# Patient Record
Sex: Male | Born: 1955 | Race: White | Hispanic: No | Marital: Married | Smoking: Former smoker
Health system: Southern US, Community
[De-identification: ages and names within clinical notes are randomized; demographics above are authoritative.]

## PROBLEM LIST (undated history)

## (undated) DIAGNOSIS — K649 Unspecified hemorrhoids: Secondary | ICD-10-CM

## (undated) DIAGNOSIS — R0602 Shortness of breath: Secondary | ICD-10-CM

## (undated) DIAGNOSIS — I313 Pericardial effusion (noninflammatory): Secondary | ICD-10-CM

## (undated) DIAGNOSIS — I3139 Other pericardial effusion (noninflammatory): Secondary | ICD-10-CM

## (undated) HISTORY — DX: Pericardial effusion (noninflammatory): I31.3

## (undated) HISTORY — PX: GANGLION CYST EXCISION: SHX1691

## (undated) HISTORY — DX: Other pericardial effusion (noninflammatory): I31.39

## (undated) HISTORY — PX: COLONOSCOPY: SHX174

## (undated) HISTORY — PX: POLYPECTOMY: SHX149

## (undated) HISTORY — DX: Shortness of breath: R06.02

## (undated) HISTORY — DX: Unspecified hemorrhoids: K64.9

---

## 2015-06-24 DIAGNOSIS — K080 Exfoliation of teeth due to systemic causes: Secondary | ICD-10-CM | POA: Diagnosis not present

## 2015-09-07 DIAGNOSIS — Z13220 Encounter for screening for lipoid disorders: Secondary | ICD-10-CM | POA: Diagnosis not present

## 2015-09-07 DIAGNOSIS — Z125 Encounter for screening for malignant neoplasm of prostate: Secondary | ICD-10-CM | POA: Diagnosis not present

## 2015-09-07 DIAGNOSIS — Z0000 Encounter for general adult medical examination without abnormal findings: Secondary | ICD-10-CM | POA: Diagnosis not present

## 2015-09-14 DIAGNOSIS — Z0000 Encounter for general adult medical examination without abnormal findings: Secondary | ICD-10-CM | POA: Diagnosis not present

## 2015-12-29 DIAGNOSIS — Z23 Encounter for immunization: Secondary | ICD-10-CM | POA: Diagnosis not present

## 2016-01-31 DIAGNOSIS — K080 Exfoliation of teeth due to systemic causes: Secondary | ICD-10-CM | POA: Diagnosis not present

## 2016-09-10 DIAGNOSIS — Z125 Encounter for screening for malignant neoplasm of prostate: Secondary | ICD-10-CM | POA: Diagnosis not present

## 2016-09-10 DIAGNOSIS — K080 Exfoliation of teeth due to systemic causes: Secondary | ICD-10-CM | POA: Diagnosis not present

## 2016-09-10 DIAGNOSIS — B029 Zoster without complications: Secondary | ICD-10-CM | POA: Diagnosis not present

## 2016-09-10 DIAGNOSIS — Z0000 Encounter for general adult medical examination without abnormal findings: Secondary | ICD-10-CM | POA: Diagnosis not present

## 2016-09-17 DIAGNOSIS — K080 Exfoliation of teeth due to systemic causes: Secondary | ICD-10-CM | POA: Diagnosis not present

## 2016-09-26 DIAGNOSIS — Z0000 Encounter for general adult medical examination without abnormal findings: Secondary | ICD-10-CM | POA: Diagnosis not present

## 2016-09-26 DIAGNOSIS — R7301 Impaired fasting glucose: Secondary | ICD-10-CM | POA: Diagnosis not present

## 2016-09-26 DIAGNOSIS — L821 Other seborrheic keratosis: Secondary | ICD-10-CM | POA: Diagnosis not present

## 2016-11-09 DIAGNOSIS — H9313 Tinnitus, bilateral: Secondary | ICD-10-CM | POA: Diagnosis not present

## 2016-11-09 DIAGNOSIS — H903 Sensorineural hearing loss, bilateral: Secondary | ICD-10-CM | POA: Diagnosis not present

## 2016-12-05 DIAGNOSIS — H903 Sensorineural hearing loss, bilateral: Secondary | ICD-10-CM | POA: Diagnosis not present

## 2016-12-05 DIAGNOSIS — Z461 Encounter for fitting and adjustment of hearing aid: Secondary | ICD-10-CM | POA: Diagnosis not present

## 2017-01-02 DIAGNOSIS — B0089 Other herpesviral infection: Secondary | ICD-10-CM | POA: Diagnosis not present

## 2017-02-12 DIAGNOSIS — H524 Presbyopia: Secondary | ICD-10-CM | POA: Diagnosis not present

## 2017-02-12 DIAGNOSIS — Z87891 Personal history of nicotine dependence: Secondary | ICD-10-CM | POA: Diagnosis not present

## 2017-02-12 DIAGNOSIS — H47091 Other disorders of optic nerve, not elsewhere classified, right eye: Secondary | ICD-10-CM | POA: Diagnosis not present

## 2017-02-12 DIAGNOSIS — H25093 Other age-related incipient cataract, bilateral: Secondary | ICD-10-CM | POA: Diagnosis not present

## 2017-03-06 DIAGNOSIS — H524 Presbyopia: Secondary | ICD-10-CM | POA: Diagnosis not present

## 2017-05-09 DIAGNOSIS — K080 Exfoliation of teeth due to systemic causes: Secondary | ICD-10-CM | POA: Diagnosis not present

## 2017-09-24 DIAGNOSIS — Z0000 Encounter for general adult medical examination without abnormal findings: Secondary | ICD-10-CM | POA: Diagnosis not present

## 2017-09-24 DIAGNOSIS — Z125 Encounter for screening for malignant neoplasm of prostate: Secondary | ICD-10-CM | POA: Diagnosis not present

## 2017-09-24 DIAGNOSIS — R7301 Impaired fasting glucose: Secondary | ICD-10-CM | POA: Diagnosis not present

## 2017-09-24 DIAGNOSIS — Z13220 Encounter for screening for lipoid disorders: Secondary | ICD-10-CM | POA: Diagnosis not present

## 2017-10-01 DIAGNOSIS — Z0000 Encounter for general adult medical examination without abnormal findings: Secondary | ICD-10-CM | POA: Diagnosis not present

## 2017-10-15 ENCOUNTER — Encounter: Payer: Self-pay | Admitting: Gastroenterology

## 2017-11-22 ENCOUNTER — Ambulatory Visit (AMBULATORY_SURGERY_CENTER): Payer: Self-pay | Admitting: *Deleted

## 2017-11-22 ENCOUNTER — Other Ambulatory Visit: Payer: Self-pay

## 2017-11-22 VITALS — Ht 67.0 in | Wt 184.0 lb

## 2017-11-22 DIAGNOSIS — Z1211 Encounter for screening for malignant neoplasm of colon: Secondary | ICD-10-CM

## 2017-11-22 MED ORDER — SUPREP BOWEL PREP KIT 17.5-3.13-1.6 GM/177ML PO SOLN: 1.0000 | Freq: Once | ORAL | 0 refills | Status: AC

## 2017-11-22 NOTE — Progress Notes (Signed)
No egg or soy allergy known to patient  No issues with past sedation with any surgeries  or procedures, no intubation problems  No diet pills per patient No home 02 use per patient  No blood thinners per patient  Pt denies issues with constipation  No A fib or A flutter  EMMI video sent to pt's e mail  

## 2017-11-25 ENCOUNTER — Encounter: Payer: Self-pay | Admitting: Gastroenterology

## 2017-11-27 ENCOUNTER — Telehealth: Payer: Self-pay | Admitting: Gastroenterology

## 2017-11-27 NOTE — Telephone Encounter (Signed)
Pt has a colon 10-11 and his work is giving flu shots 10-8- pt asked if this was ok for him to get the flu shot 10-8- instructed is okay. No issues  For colon 10-11  Marijean Niemann

## 2017-12-06 ENCOUNTER — Encounter: Payer: Self-pay | Admitting: Gastroenterology

## 2017-12-06 ENCOUNTER — Ambulatory Visit (AMBULATORY_SURGERY_CENTER): Payer: Federal, State, Local not specified - PPO | Admitting: Gastroenterology

## 2017-12-06 VITALS — BP 108/70 | HR 64 | Temp 98.2°F | Resp 14 | Ht 67.0 in | Wt 184.0 lb

## 2017-12-06 DIAGNOSIS — Z1211 Encounter for screening for malignant neoplasm of colon: Secondary | ICD-10-CM | POA: Diagnosis not present

## 2017-12-06 DIAGNOSIS — D122 Benign neoplasm of ascending colon: Secondary | ICD-10-CM | POA: Diagnosis not present

## 2017-12-06 DIAGNOSIS — D125 Benign neoplasm of sigmoid colon: Secondary | ICD-10-CM | POA: Diagnosis not present

## 2017-12-06 DIAGNOSIS — K640 First degree hemorrhoids: Secondary | ICD-10-CM

## 2017-12-06 MED ORDER — SODIUM CHLORIDE 0.9 % IV SOLN: 500.0000 mL | Freq: Once | INTRAVENOUS | Status: DC

## 2017-12-06 NOTE — Patient Instructions (Signed)
Impression/Recommendations:  Polyp handout given to patient. Hemorrhoid handout given to patient. Resume previous diet. Resume Aspirin at prior dose in 5 days.  Repeat colonoscopy in 3-5 years for surveillance based on pathology results.  YOU HAD AN ENDOSCOPIC PROCEDURE TODAY AT [facility]:   Refer to the procedure report that was given to you for any specific questions about what was found during the examination.  If the procedure report does not answer your questions, please call your gastroenterologist to clarify.  If you requested that your care partner not be given the details of your procedure findings, then the procedure report has been included in a sealed envelope for you to review at your convenience later.  YOU SHOULD EXPECT: Some feelings of bloating in the abdomen. Passage of more gas than usual.  Walking can help get rid of the air that was put into your GI tract during the procedure and reduce the bloating. If you had a lower endoscopy (such as a colonoscopy or flexible sigmoidoscopy) you may notice spotting of blood in your stool or on the toilet paper. If you underwent a bowel prep for your procedure, you may not have a normal bowel movement for a few days.  Please Note:  You might notice some irritation and congestion in your nose or some drainage.  This is from the oxygen used during your procedure.  There is no need for concern and it should clear up in a day or so.  SYMPTOMS TO REPORT IMMEDIATELY:   Following lower endoscopy (colonoscopy or flexible sigmoidoscopy):  Excessive amounts of blood in the stool  Significant tenderness or worsening of abdominal pains  Swelling of the abdomen that is new, acute  Fever of 100F or higher  For urgent or emergent issues, a gastroenterologist can be reached at any hour by calling [phone].   DIET:  We do recommend a small meal at first, but then you may proceed to your regular diet.  Drink plenty of fluids  but you should avoid alcoholic beverages for 24 hours.  ACTIVITY:  You should plan to take it easy for the rest of today and you should NOT DRIVE or use heavy machinery until tomorrow (because of the sedation medicines used during the test).    FOLLOW UP: Our staff will call the number listed on your records the next business day following your procedure to check on you and address any questions or concerns that you may have regarding the information given to you following your procedure. If we do not reach you, we will leave a message.  However, if you are feeling well and you are not experiencing any problems, there is no need to return our call.  We will assume that you have returned to your regular daily activities without incident.  If any biopsies were taken you will be contacted by phone or by letter within the next 1-3 weeks.  Please call us at [phone] if you have not heard about the biopsies in 3 weeks.    SIGNATURES/CONFIDENTIALITY: You and/or your care partner have signed paperwork which will be entered into your electronic medical record.  These signatures attest to the fact that that the information above on your After Visit Summary has been reviewed and is understood.  Full responsibility of the confidentiality of this discharge information lies with you and/or your care-partner.

## 2017-12-06 NOTE — Op Note (Signed)
[place] Patient Name: Levi Knight Procedure Date: 12/06/2017 8:57 AM MRN: [id number] Endoscopist: Gerrit Heck , MD Age: 62 Referring MD:  Date of Birth: 05/12/1955 Gender: Male Account #: [id number] Procedure:                Colonoscopy Indications:              Screening for colorectal malignant neoplasm, This                            is the patient's first colonoscopy Medicines:                Monitored Anesthesia Care Procedure:                Pre-Anesthesia Assessment:                           - Prior to the procedure, a History and Physical                            was performed, and patient medications and                            allergies were reviewed. The patient's tolerance of                            previous anesthesia was also reviewed. The risks                            and benefits of the procedure and the sedation                            options and risks were discussed with the patient.                            All questions were answered, and informed consent                            was obtained. Prior Anticoagulants: The patient has                            taken aspirin, last dose was 5 days prior to                            procedure. ASA Grade Assessment: II - A patient                            with mild systemic disease. After reviewing the                            risks and benefits, the patient was deemed in                            satisfactory condition to undergo the procedure.  After obtaining informed consent, the colonoscope                            was passed under direct vision. Throughout the                            procedure, the patient's blood pressure, pulse, and                            oxygen saturations were monitored continuously. The                            Colonoscope was introduced through the anus and                            advanced to the the  cecum, identified by                            appendiceal orifice and ileocecal valve. The                            colonoscopy was performed without difficulty. The                            patient tolerated the procedure well. The quality                            of the bowel preparation was adequate. Scope In: 9:13:40 AM Scope Out: 9:33:11 AM Scope Withdrawal Time: 0 hours 15 minutes 40 seconds  Total Procedure Duration: 0 hours 19 minutes 31 seconds  Findings:                 The perianal and digital rectal examinations were                            normal.                           A 3 mm polyp was found in the ascending colon. The                            polyp was sessile. The polyp was removed with a                            cold snare. Resection and retrieval were complete.                            Estimated blood loss was minimal.                           A 3 mm polyp was found in the sigmoid colon. The                            polyp was sessile. The polyp was removed with  a                            cold snare. Resection and retrieval were complete.                            Estimated blood loss was minimal.                           Two sessile polyps were found in the sigmoid colon.                            The polyps were 3 to 5 mm in size. These polyps                            were removed with a cold snare. Resection and                            retrieval were complete. Estimated blood loss was                            minimal.                           Non-bleeding internal hemorrhoids were found during                            retroflexion. The hemorrhoids were small. Complications:            No immediate complications. Estimated Blood Loss:     Estimated blood loss was minimal. Impression:               - One 3 mm polyp in the ascending colon, removed                            with a cold snare. Resected and retrieved.                            - One 3 mm polyp in the sigmoid colon, removed with                            a cold snare. Resected and retrieved.                           - Two 3 to 5 mm polyps in the sigmoid colon,                            removed with a cold snare. Resected and retrieved.                           - Non-bleeding internal hemorrhoids. Recommendation:           - Patient has a contact number available for  emergencies. The signs and symptoms of potential                            delayed complications were discussed with the                            patient. Return to normal activities tomorrow.                            Written discharge instructions were provided to the                            patient.                           - Resume previous diet today.                           - Resume aspirin at prior dose in 5 days.                           - Await pathology results.                           - Repeat colonoscopy in 3 - 5 years for                            surveillance based on pathology results.                           - Return to GI clinic PRN.                           - Internal hemorrhoids were noted on this study and                            may be amenable to hemorrhoid band ligation. If you                            are interested in further treatment of these                            hemorrhoids with band ligation, please contact my                            clinic to set up an appointment for evaluation and                            treatment. Gerrit Heck, MD 12/06/2017 9:37:24 AM

## 2017-12-06 NOTE — Progress Notes (Signed)
To PACU, vss patent aw report to rn 

## 2017-12-06 NOTE — Progress Notes (Signed)
Called to room to assist during endoscopic procedure.  Patient ID and intended procedure confirmed with present staff. Received instructions for my participation in the procedure from the performing physician.  

## 2017-12-09 ENCOUNTER — Telehealth: Payer: Self-pay

## 2017-12-09 NOTE — Telephone Encounter (Signed)
  Follow up Call-  Call Yasser Hepp number 12/06/2017  Post procedure Call Jamone Garrido phone  # [phone]  Permission to leave phone message No  Some recent data might be hidden     Patient questions:  Do you have a fever, pain , or abdominal swelling? No. Pain Score  0 *  Have you tolerated food without any problems? Yes.    Have you been able to return to your normal activities? Yes.    Do you have any questions about your discharge instructions: Diet   No. Medications  No. Follow up visit  No.  Do you have questions or concerns about your Care? No.  Actions: * If pain score is 4 or above: No action needed, pain <4.

## 2017-12-12 ENCOUNTER — Encounter: Payer: Self-pay | Admitting: Gastroenterology

## 2018-01-17 DIAGNOSIS — K080 Exfoliation of teeth due to systemic causes: Secondary | ICD-10-CM | POA: Diagnosis not present

## 2018-03-25 DIAGNOSIS — R062 Wheezing: Secondary | ICD-10-CM | POA: Diagnosis not present

## 2018-03-25 DIAGNOSIS — J9811 Atelectasis: Secondary | ICD-10-CM | POA: Diagnosis not present

## 2018-03-25 DIAGNOSIS — J069 Acute upper respiratory infection, unspecified: Secondary | ICD-10-CM | POA: Diagnosis not present

## 2018-04-08 DIAGNOSIS — J0100 Acute maxillary sinusitis, unspecified: Secondary | ICD-10-CM | POA: Diagnosis not present

## 2018-04-08 DIAGNOSIS — R05 Cough: Secondary | ICD-10-CM | POA: Diagnosis not present

## 2018-04-08 DIAGNOSIS — R0789 Other chest pain: Secondary | ICD-10-CM | POA: Diagnosis not present

## 2018-05-06 DIAGNOSIS — M79671 Pain in right foot: Secondary | ICD-10-CM | POA: Diagnosis not present

## 2018-05-12 ENCOUNTER — Other Ambulatory Visit: Payer: Self-pay | Admitting: Family Medicine

## 2018-05-12 ENCOUNTER — Other Ambulatory Visit: Payer: Self-pay

## 2018-05-12 ENCOUNTER — Ambulatory Visit (HOSPITAL_COMMUNITY)
Admission: RE | Admit: 2018-05-12 | Discharge: 2018-05-12 | Disposition: A | Discharge status: Home / Self Care | Payer: Federal, State, Local not specified - PPO | Source: Ambulatory Visit | Attending: Family Medicine | Admitting: Family Medicine

## 2018-05-12 ENCOUNTER — Other Ambulatory Visit (HOSPITAL_COMMUNITY): Payer: Self-pay | Admitting: Family Medicine

## 2018-05-12 DIAGNOSIS — R0602 Shortness of breath: Secondary | ICD-10-CM | POA: Diagnosis not present

## 2018-05-12 DIAGNOSIS — R079 Chest pain, unspecified: Secondary | ICD-10-CM

## 2018-05-12 DIAGNOSIS — R000 Tachycardia, unspecified: Secondary | ICD-10-CM

## 2018-05-12 DIAGNOSIS — R6889 Other general symptoms and signs: Secondary | ICD-10-CM | POA: Diagnosis not present

## 2018-05-12 MED ORDER — IOHEXOL 350 MG/ML SOLN
75.0000 mL | Freq: Once | INTRAVENOUS | Status: AC | PRN
  Administered 2018-05-12: 75 mL via INTRAVENOUS

## 2018-05-14 NOTE — Progress Notes (Signed)
Patient referred by Janie Morning for Pericardial effusion  Subjective:   @Patient  ID: Levi Knight, male    DOB: 03-Nov-1955, 63 y.o.   MRN: [id number]  Chief Complaint  Patient presents with  . New Patient (Initial Visit)  . Results    abn CT  . Shortness of Breath  . Pericardial Effusion    HPI Mr. Levi Knight is a 63 year old Caucasian male with no significant prior cardiovascular history, there is no history of hypertension, diabetes, hyperlipidemia, tobacco use, referred to me for evaluation of chest pain that started after he had a bout of flu in end of January 2020, since then has had shortness of breath and was treated again for pneumonia then developed persistent chest tightness and also shortness of breath.  Chest pain described as sharp pain to pressure-like sensation when she takes a deep breath, unable to take deep breath due to chest pain.  No hemoptysis, no leg edema.  Symptoms have been persistent since last 6 weeks.  Also states that he is unable to lay down flat and feels better when he sits up due to chest pain.  Accompanied by his wife.  Past Medical History:  Diagnosis Date  . Hemorrhoids   . Pericardial effusion   . SOB (shortness of breath)     Past Surgical History:  Procedure Laterality Date  . GANGLION CYST EXCISION      Social History   Socioeconomic History  . Marital status: Married    Spouse name: Not on file  . Number of children: 2  . Years of education: Not on file  . Highest education level: Not on file  Occupational History  . Not on file  Social Needs  . Financial resource strain: Not on file  . Food insecurity:    Worry: Not on file    Inability: Not on file  . Transportation needs:    Medical: Not on file    Non-medical: Not on file  Tobacco Use  . Smoking status: Former Smoker    Packs/day: 1.50    Years: 10.00    Pack years: 15.00    Types: Cigarettes    Last attempt to quit: 05/15/1978    Years since quitting:  40.0  . Smokeless tobacco: Current User    Types: Snuff  Substance and Sexual Activity  . Alcohol use: Yes    Alcohol/week: 3.0 standard drinks    Types: 3 Cans of beer per week    Comment: on weekends  . Drug use: Never  . Sexual activity: Not on file  Lifestyle  . Physical activity:    Days per week: Not on file    Minutes per session: Not on file  . Stress: Not on file  Relationships  . Social connections:    Talks on phone: Not on file    Gets together: Not on file    Attends religious service: Not on file    Active member of club or organization: Not on file    Attends meetings of clubs or organizations: Not on file    Relationship status: Not on file  . Intimate partner violence:    Fear of current or ex partner: Not on file    Emotionally abused: Not on file    Physically abused: Not on file    Forced sexual activity: Not on file  Other Topics Concern  . Not on file  Social History Narrative  . Not on file    Current  Outpatient Medications on File Prior to Visit  Medication Sig Dispense Refill  . aspirin EC 325 MG tablet Take 325 mg by mouth. Twice weekly    . Multiple Vitamin (MULTI-VITAMIN DAILY PO) Take by mouth.    . Multiple Vitamins-Minerals (EMERGEN-C FIVE) PACK Take by mouth daily.    . naproxen sodium (ALEVE) 220 MG tablet Take 220 mg by mouth continuous as needed.     Marland Kitchen OVER THE COUNTER MEDICATION Super B complex po daily    . UNABLE TO FIND Osteo bi-Flex po daily    . UNABLE TO FIND Ester C vit. C 1000 mg po daily    . UNABLE TO FIND Echinacea po daily    . UNABLE TO FIND Urinozinc-Prostate po daily    . valACYclovir (VALTREX) 1000 MG tablet as needed.     No current facility-administered medications on file prior to visit.     Cardiac Studies:  CT Chest Angio 05/12/2018: 1.  No evidence of pulmonary embolism. 2. Small to moderate pericardial effusion. Small bilateral pleural effusions. 3. Several 5-6 mm pulmonary nodules in both lungs.  Non-contrast chest CT at 3-6 months is recommended.  Recent Labs:  05/12/2018: Cr 1.2. H/H 16.5/49. MCV 89.1. WBW 4.7. RBC 5.5. Rest of CBC normal.  Rapid Influenza A&B Negative.   Review of Systems  Constitutional: Negative for unexpected weight change.  HENT: Negative for congestion.   Eyes: Negative for visual disturbance.  Respiratory: Positive for shortness of breath (feels like he is unable to take deep breath due to pain).   Cardiovascular: Positive for chest pain (on taking deep breath).  Gastrointestinal: Negative for abdominal pain, nausea and vomiting.  Endocrine: Negative for cold intolerance.  Genitourinary: Negative for dysuria.  Musculoskeletal: Negative for myalgias.  Skin: Negative for rash.  Allergic/Immunologic: Negative for immunocompromised state.  Neurological: Negative for dizziness.  Hematological: Does not bruise/bleed easily.  Psychiatric/Behavioral: The patient is not nervous/anxious.   All other systems reviewed and are negative.     Objective: Blood pressure 108/80, pulse 92, height 5\' 7"  (1.702 m), weight 184 lb 8 oz (83.7 kg), SpO2 95 %.  Body mass index is 28.9 kg/m.    Physical Exam Vitals signs and nursing note reviewed.  Constitutional:      General: He is not in acute distress.    Appearance: Normal appearance. He is not toxic-appearing.  Eyes:     Pupils: Pupils are equal, round, and reactive to light.  Neck:     Musculoskeletal: Neck supple. No muscular tenderness.  Cardiovascular:     Rate and Rhythm: Normal rate.     Pulses: Normal pulses.     Heart sounds: Normal heart sounds. No friction rub.  Abdominal:     General: Abdomen is flat. Bowel sounds are normal. There is no distension.     Palpations: Abdomen is soft.  Musculoskeletal:        General: No tenderness.  Lymphadenopathy:     Cervical: No cervical adenopathy.  Skin:    General: Skin is warm.  Neurological:     General: No focal deficit present.     Mental Status: He  is alert and oriented to person, place, and time.  Psychiatric:        Mood and Affect: Mood normal.       Assessment & Recommendations:   Acute idiopathic pericarditis - Plan: colchicine 0.6 MG tablet, indomethacin (INDOCIN) 25 MG capsule, PCV ECHOCARDIOGRAM COMPLETE  Pericardial effusion - Plan: EKG 12-Lead, PCV ECHOCARDIOGRAM  COMPLETE  SOB (shortness of breath)  Gastroesophageal reflux disease without esophagitis   EKG 05/15/2018: Normal sinus rhythm at rate of 92 bpm, normal axis.  No evidence of ischemia, normal EKG.  Borderline low voltage complexes. Thank you for referring the patient to [place]. Please feel free to contact with any questions.  Recommendation: Patient referred to me for evaluation of shortness of breath and chest pain, abnormal CT scan revealing moderate size pericardial effusion.  His clinical symptomatology and presentation is very consistent with probably idiopathic pericarditis although he had flu month ago.  There is no clinical evidence of heart failure or  Tamponade.  I'll start the patient on colchicine 0.6 minute grams p.o. b.i.d. for a total of 3 months as it is the initial episode of peritonitis.  I'll also start him on indomethacin 25 g p.o. b.i.d. for 3-4 weeks.  Pepcid was prescribed prophylactically for GERD as he does have occasional mild GERD.  I'd like to obtain an echocardiogram to further evaluate pericardial effusion.  I'll see him back in 3-4 weeks for follow-up unless symptoms get worse he is to call me sooner.  Dyspnea is related to pleural pericarditis and pleurodynia.  Patient also improved with NSAIDs.  Adrian Prows, MD, [facility] 05/15/2018, 6:29 PM [facility] [place] Pager: [phone] Office: [phone] If no answer Cell [phone]

## 2018-05-15 ENCOUNTER — Other Ambulatory Visit: Payer: Self-pay

## 2018-05-15 ENCOUNTER — Ambulatory Visit: Payer: BLUE CROSS/BLUE SHIELD | Admitting: Cardiology

## 2018-05-15 ENCOUNTER — Encounter: Payer: Self-pay | Admitting: Cardiology

## 2018-05-15 VITALS — BP 108/80 | HR 92 | Ht 67.0 in | Wt 184.5 lb

## 2018-05-15 DIAGNOSIS — I313 Pericardial effusion (noninflammatory): Secondary | ICD-10-CM

## 2018-05-15 DIAGNOSIS — R0602 Shortness of breath: Secondary | ICD-10-CM | POA: Diagnosis not present

## 2018-05-15 DIAGNOSIS — I3139 Other pericardial effusion (noninflammatory): Secondary | ICD-10-CM

## 2018-05-15 DIAGNOSIS — I300 Acute nonspecific idiopathic pericarditis: Secondary | ICD-10-CM

## 2018-05-15 DIAGNOSIS — K219 Gastro-esophageal reflux disease without esophagitis: Secondary | ICD-10-CM | POA: Diagnosis not present

## 2018-05-15 MED ORDER — INDOMETHACIN 25 MG PO CAPS: 25.0000 mg | ORAL_CAPSULE | Freq: Two times a day (BID) | ORAL | 0 refills | Status: AC

## 2018-05-15 MED ORDER — COLCHICINE 0.6 MG PO TABS: 0.6000 mg | ORAL_TABLET | Freq: Two times a day (BID) | ORAL | 2 refills | Status: AC

## 2018-05-15 MED ORDER — FAMOTIDINE 40 MG PO TABS: 40.0000 mg | ORAL_TABLET | Freq: Every day | ORAL | 1 refills | Status: AC

## 2018-05-23 ENCOUNTER — Other Ambulatory Visit: Payer: BLUE CROSS/BLUE SHIELD

## 2018-05-27 ENCOUNTER — Institutional Professional Consult (permissible substitution): Payer: Federal, State, Local not specified - PPO | Admitting: Pulmonary Disease

## 2018-06-09 ENCOUNTER — Other Ambulatory Visit: Payer: BLUE CROSS/BLUE SHIELD

## 2018-06-13 ENCOUNTER — Ambulatory Visit: Payer: BLUE CROSS/BLUE SHIELD | Admitting: Cardiology

## 2018-09-30 DIAGNOSIS — Z0000 Encounter for general adult medical examination without abnormal findings: Secondary | ICD-10-CM | POA: Diagnosis not present

## 2018-09-30 DIAGNOSIS — Z03818 Encounter for observation for suspected exposure to other biological agents ruled out: Secondary | ICD-10-CM | POA: Diagnosis not present

## 2018-10-07 DIAGNOSIS — I313 Pericardial effusion (noninflammatory): Secondary | ICD-10-CM | POA: Diagnosis not present

## 2018-10-07 DIAGNOSIS — Z125 Encounter for screening for malignant neoplasm of prostate: Secondary | ICD-10-CM | POA: Diagnosis not present

## 2018-10-07 DIAGNOSIS — Z7189 Other specified counseling: Secondary | ICD-10-CM | POA: Diagnosis not present

## 2018-10-07 DIAGNOSIS — N400 Enlarged prostate without lower urinary tract symptoms: Secondary | ICD-10-CM | POA: Diagnosis not present

## 2018-10-07 DIAGNOSIS — Z0000 Encounter for general adult medical examination without abnormal findings: Secondary | ICD-10-CM | POA: Diagnosis not present

## 2018-10-15 DIAGNOSIS — I313 Pericardial effusion (noninflammatory): Secondary | ICD-10-CM | POA: Diagnosis not present

## 2019-01-06 ENCOUNTER — Telehealth: Payer: Self-pay

## 2019-10-01 DIAGNOSIS — Z0000 Encounter for general adult medical examination without abnormal findings: Secondary | ICD-10-CM | POA: Diagnosis not present

## 2019-10-01 DIAGNOSIS — Z125 Encounter for screening for malignant neoplasm of prostate: Secondary | ICD-10-CM | POA: Diagnosis not present

## 2019-10-01 DIAGNOSIS — Z13220 Encounter for screening for lipoid disorders: Secondary | ICD-10-CM | POA: Diagnosis not present

## 2019-10-01 DIAGNOSIS — R946 Abnormal results of thyroid function studies: Secondary | ICD-10-CM | POA: Diagnosis not present

## 2019-10-01 DIAGNOSIS — R7309 Other abnormal glucose: Secondary | ICD-10-CM | POA: Diagnosis not present

## 2019-10-08 DIAGNOSIS — Z0000 Encounter for general adult medical examination without abnormal findings: Secondary | ICD-10-CM | POA: Diagnosis not present

## 2019-10-08 DIAGNOSIS — Z23 Encounter for immunization: Secondary | ICD-10-CM | POA: Diagnosis not present

## 2020-05-11 IMAGING — CT CT ANGIOGRAPHY CHEST
3 of 7 series · 18 of 36 positions shown · IV contrast (omnipaque)
Comparison: None.

CLINICAL DATA: Chest pain and shortness of breath.

EXAM:
CT ANGIOGRAPHY CHEST WITH CONTRAST
TECHNIQUE: Multidetector CT imaging of the chest was performed using the
standard protocol during bolus administration of intravenous
contrast. Multiplanar CT image reconstructions and MIPs were
obtained to evaluate the vascular anatomy.
CONTRAST:  75mL OMNIPAQUE IOHEXOL 350 MG/ML SOLN

[Series 6: pe lung · axial · 0.81mm/px · z∈[+1228,+1282]mm · 2 of 137 slices shown]
[im 28/137  mediastinal]
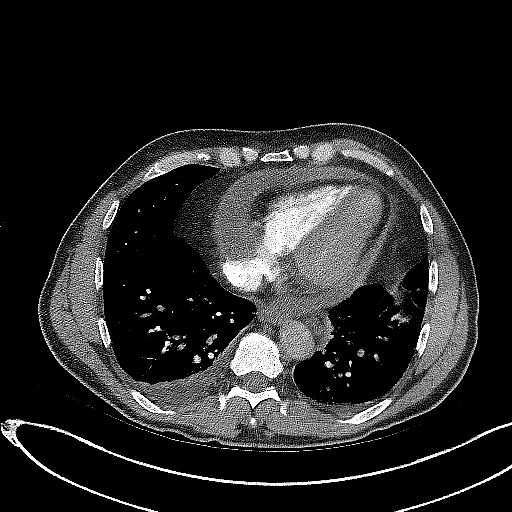
[im 55/137  mediastinal]
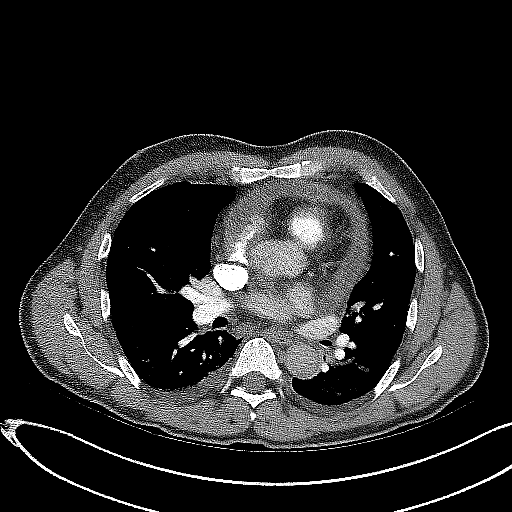

[Series 7: pe thins · axial · 0.81mm/px · z∈[+1174,+1428]mm · 15 of 415 slices shown]
[im 26/415  lung]
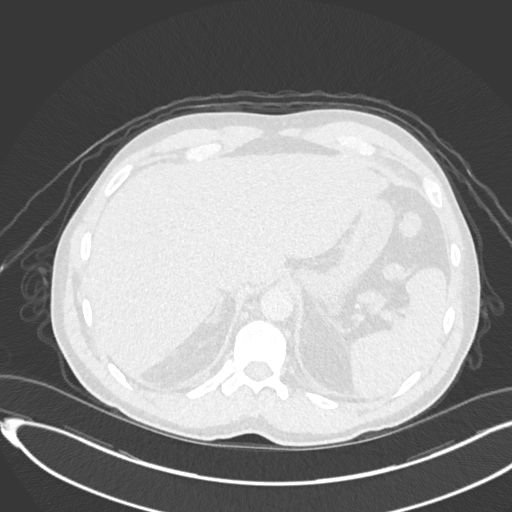
[im 52/415  mediastinal]
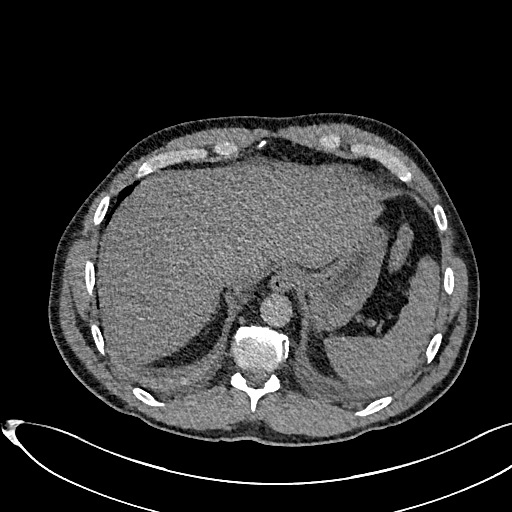
[im 78/415  lung]
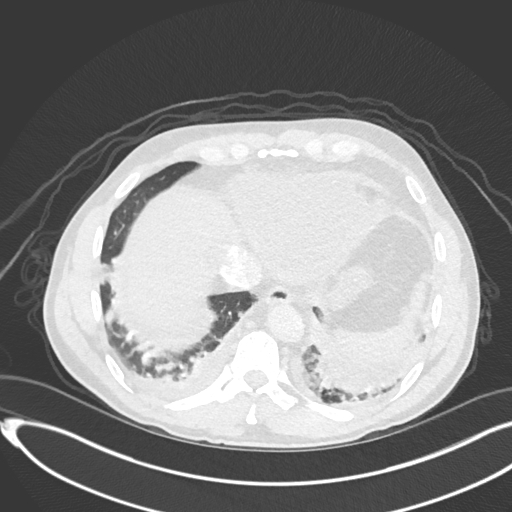
[im 104/415  mediastinal]
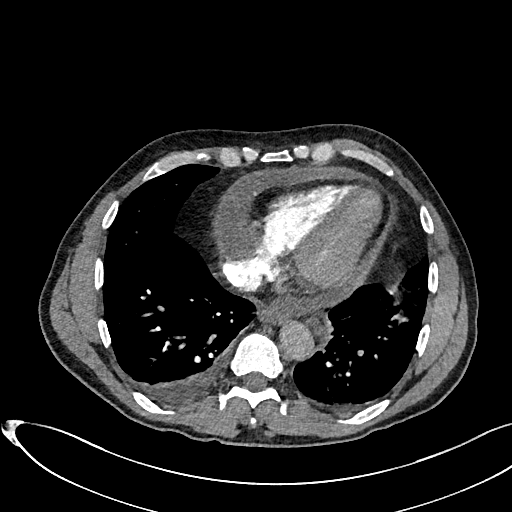
[im 130/415  lung]
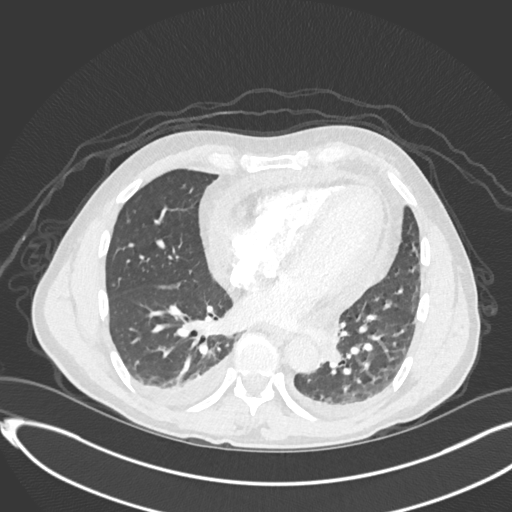
[im 156/415  mediastinal]
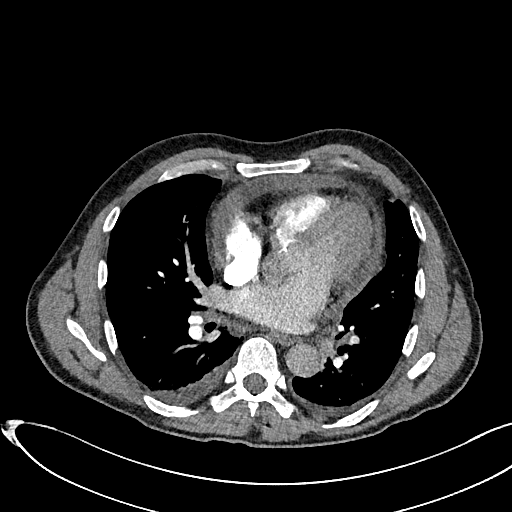
[im 182/415  lung]
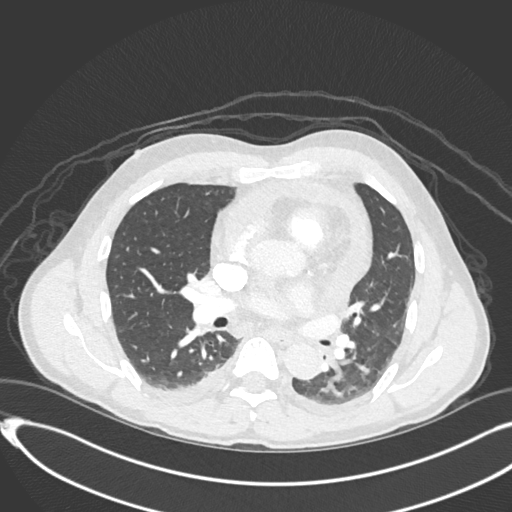
[im 208/415  mediastinal]
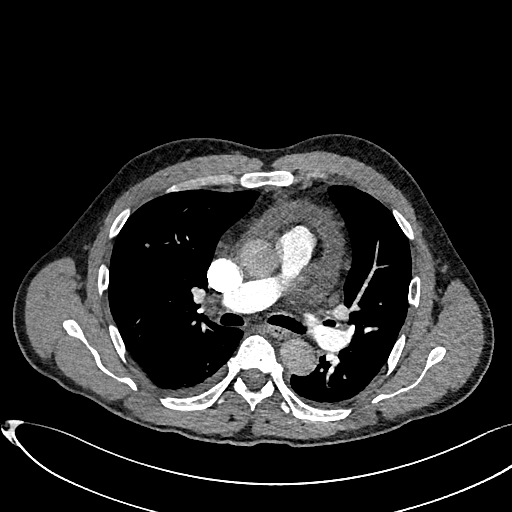
[im 233/415  lung]
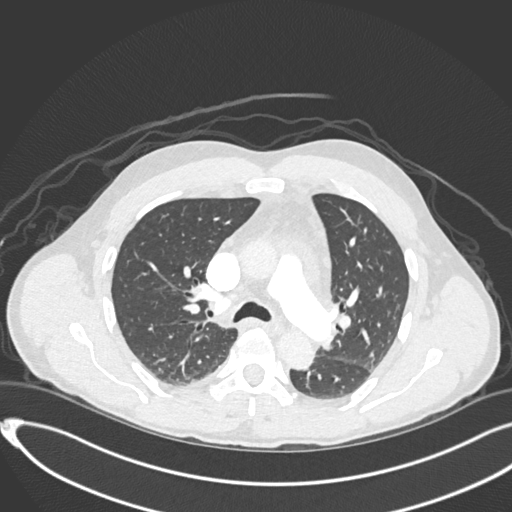
[im 259/415  mediastinal]
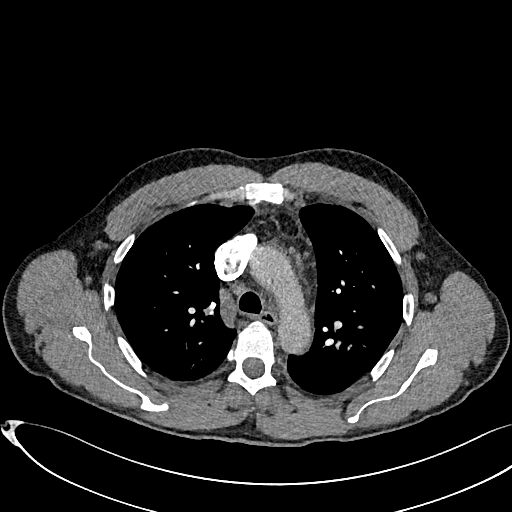
[im 285/415  lung]
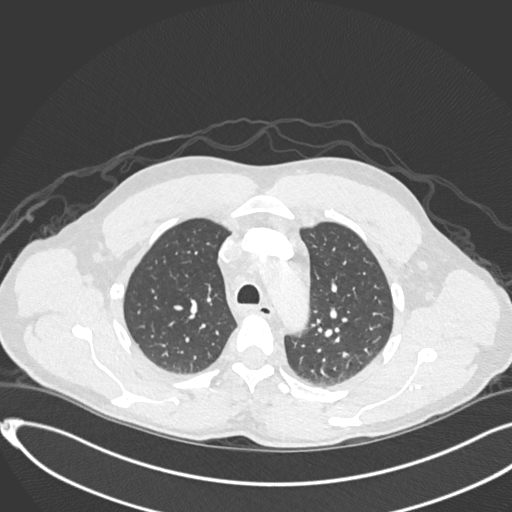
[im 311/415  mediastinal]
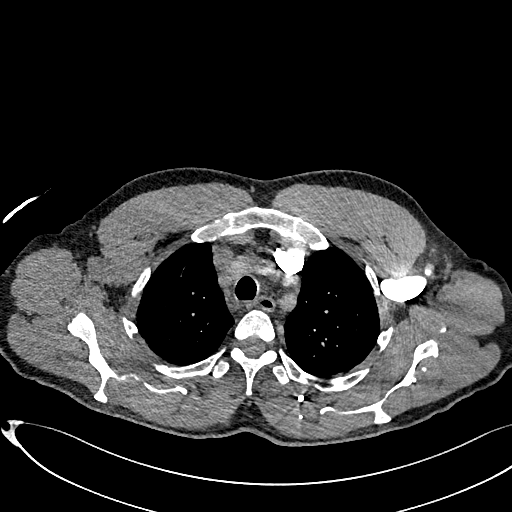
[im 337/415  lung]
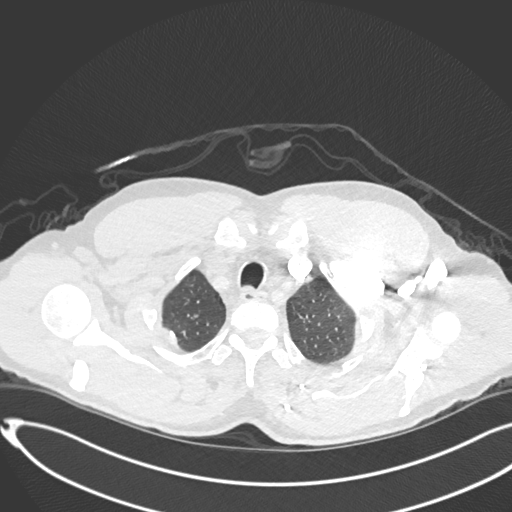
[im 363/415  mediastinal]
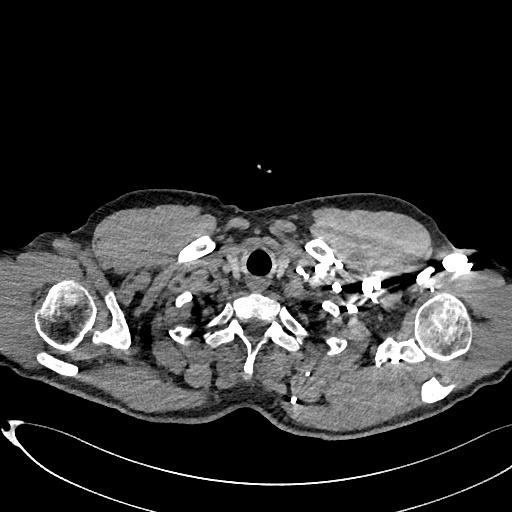
[im 389/415  lung]
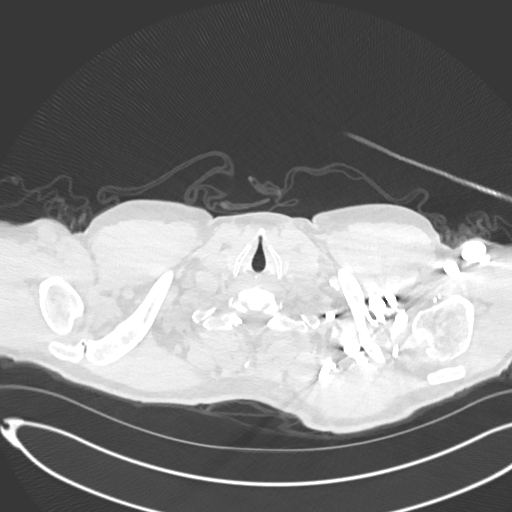

[Series 8: pe 2mm cor · coronal · 0.58mm/px · 1 of 136 slices shown]
[im 68/136  mediastinal]
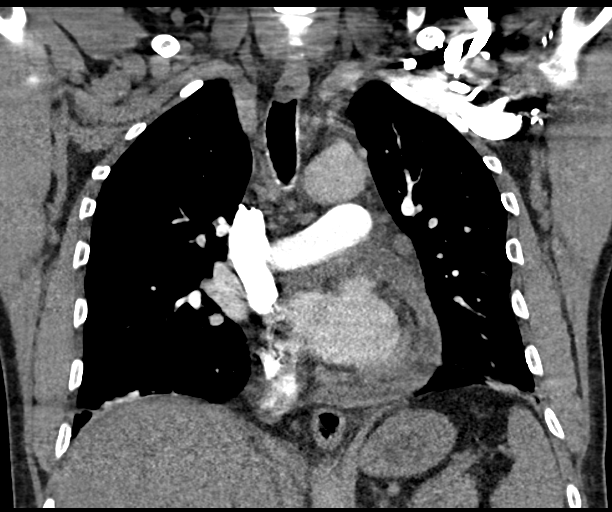

[18 of 36 positions shown; findings below may reference images not displayed]

FINDINGS: Cardiovascular: Satisfactory opacification of the pulmonary arteries
to the segmental level. No evidence of pulmonary embolism. Normal
heart size. Small to moderate simple pericardial effusion. No
thoracic aortic aneurysm. Minimal aortic arch atherosclerosis.

Mediastinum/Nodes: Mildly enlarged aortopulmonary window lymph node
measuring 11 short axis. Additional subcentimeter mediastinal and
hilar lymph nodes not enlarged by CT size criteria. No enlarged
axillary lymph nodes. The thyroid gland, trachea, and esophagus
demonstrate no significant findings.

Lungs/Pleura: Small right greater than left pleural effusions with
adjacent subsegmental lower lobe atelectasis. No consolidation or
pneumothorax. 6 x 5 mm pulmonary nodule in the left upper lobe
(series 6, image 72). 6 x 5 mm pulmonary nodule in the right upper
lobe along the minor fissure (series 6, image 74). 5 mm pulmonary
nodule in the right lower lobe along the major fissure (series 6,
image 87).

Upper Abdomen: No acute abnormality.

Musculoskeletal: No chest wall abnormality. No acute or significant
osseous findings.

Review of the MIP images confirms the above findings.
IMPRESSION: 1.  No evidence of pulmonary embolism.
2. Small to moderate pericardial effusion. Small bilateral pleural
effusions.
3. Several 5-6 mm pulmonary nodules in both lungs. Non-contrast
chest CT at 3-6 months is recommended. If the nodules are stable at
time of repeat CT, then future CT at 18-24 months (from today's
scan) is considered optional for low-risk patients, but is
recommended for high-risk patients. This recommendation follows the
consensus statement: Guidelines for Management of Incidental
Pulmonary Nodules Detected on CT Images: From the [HOSPITAL]

These results will be called to the ordering clinician or
representative by the [HOSPITAL] at the imaging location.

## 2020-10-12 DIAGNOSIS — Z0000 Encounter for general adult medical examination without abnormal findings: Secondary | ICD-10-CM | POA: Diagnosis not present

## 2020-10-12 DIAGNOSIS — R7301 Impaired fasting glucose: Secondary | ICD-10-CM | POA: Diagnosis not present

## 2020-10-12 DIAGNOSIS — R946 Abnormal results of thyroid function studies: Secondary | ICD-10-CM | POA: Diagnosis not present

## 2020-10-12 DIAGNOSIS — B0089 Other herpesviral infection: Secondary | ICD-10-CM | POA: Diagnosis not present

## 2021-11-15 ENCOUNTER — Encounter: Payer: Self-pay | Admitting: Family Medicine

## 2021-12-19 ENCOUNTER — Encounter: Payer: Self-pay | Admitting: Gastroenterology

## 2022-01-02 ENCOUNTER — Ambulatory Visit (AMBULATORY_SURGERY_CENTER): Payer: Self-pay | Admitting: *Deleted

## 2022-01-02 ENCOUNTER — Telehealth: Payer: Self-pay | Admitting: *Deleted

## 2022-01-02 VITALS — Ht 67.0 in | Wt 177.8 lb

## 2022-01-02 DIAGNOSIS — Z86010 Personal history of colonic polyps: Secondary | ICD-10-CM

## 2022-01-02 MED ORDER — PEG 3350-KCL-NA BICARB-NACL 420 G PO SOLR: 4000.0000 mL | Freq: Once | ORAL | 0 refills | Status: AC

## 2022-01-02 NOTE — Telephone Encounter (Signed)
Spoke with pt,explained that I forgot to include on instructions for him not to use snuff on the day of procedure,explained to pt. If he used snuff they will cancel procedure,he verbalized understanding and thank me for letting him know.

## 2022-01-02 NOTE — Progress Notes (Signed)
No egg or soy allergy known to patient  No issues known to pt with past sedation with any surgeries or procedures Patient denies ever being told they had issues or difficulty with intubation  No FH of Malignant Hyperthermia Pt is not on diet pills Pt is not on  home 02  Pt is not on blood thinners  Pt denies issues with constipation  No A fib or A flutter Have any cardiac testing pending--no Pt instructed to use Singlecare[employer]com or GoodRx for a price reduction on prep  Patient's chart reviewed by Levi Knight CNRA prior to previsit and patient appropriate for the [facility].  Previsit completed and red dot placed by patient's name on their procedure day (on provider's schedule).    

## 2022-01-29 ENCOUNTER — Encounter: Payer: Federal, State, Local not specified - PPO | Admitting: Gastroenterology
